# Patient Record
Sex: Female | Born: 1955
Health system: Southern US, Community
[De-identification: ages and names within clinical notes are randomized; demographics above are authoritative.]

## PROBLEM LIST (undated history)

## (undated) DIAGNOSIS — E7800 Pure hypercholesterolemia, unspecified: Secondary | ICD-10-CM

## (undated) HISTORY — PX: ABDOMINAL HYSTERECTOMY: SHX81

---

## 1998-08-01 ENCOUNTER — Encounter: Admission: RE | Admit: 1998-08-01 | Discharge: 1998-10-30 | Payer: Self-pay

## 1998-09-18 ENCOUNTER — Encounter: Admission: RE | Admit: 1998-09-18 | Discharge: 1998-12-17 | Payer: Self-pay | Admitting: Cardiology

## 1999-12-26 ENCOUNTER — Emergency Department (HOSPITAL_COMMUNITY): Admission: EM | Admit: 1999-12-26 | Discharge: 1999-12-26 | Payer: Self-pay | Admitting: Emergency Medicine

## 2000-02-22 ENCOUNTER — Emergency Department (HOSPITAL_COMMUNITY): Admission: EM | Admit: 2000-02-22 | Discharge: 2000-02-23 | Payer: Self-pay | Admitting: Emergency Medicine

## 2000-03-02 ENCOUNTER — Encounter: Payer: Self-pay | Admitting: Emergency Medicine

## 2000-03-02 ENCOUNTER — Emergency Department (HOSPITAL_COMMUNITY): Admission: EM | Admit: 2000-03-02 | Discharge: 2000-03-02 | Payer: Self-pay | Admitting: Emergency Medicine

## 2000-04-14 ENCOUNTER — Ambulatory Visit (HOSPITAL_COMMUNITY): Admission: RE | Admit: 2000-04-14 | Discharge: 2000-04-14 | Payer: Self-pay | Admitting: Internal Medicine

## 2000-04-14 ENCOUNTER — Encounter: Payer: Self-pay | Admitting: Internal Medicine

## 2000-09-23 ENCOUNTER — Emergency Department (HOSPITAL_COMMUNITY): Admission: EM | Admit: 2000-09-23 | Discharge: 2000-09-23 | Payer: Self-pay | Admitting: Emergency Medicine

## 2001-04-18 ENCOUNTER — Encounter: Payer: Self-pay | Admitting: Internal Medicine

## 2001-04-18 ENCOUNTER — Ambulatory Visit (HOSPITAL_COMMUNITY): Admission: RE | Admit: 2001-04-18 | Discharge: 2001-04-18 | Payer: Self-pay | Admitting: Internal Medicine

## 2002-04-20 ENCOUNTER — Encounter: Payer: Self-pay | Admitting: Internal Medicine

## 2002-04-20 ENCOUNTER — Ambulatory Visit (HOSPITAL_COMMUNITY): Admission: RE | Admit: 2002-04-20 | Discharge: 2002-04-20 | Payer: Self-pay | Admitting: Internal Medicine

## 2003-04-25 ENCOUNTER — Ambulatory Visit (HOSPITAL_COMMUNITY): Admission: RE | Admit: 2003-04-25 | Discharge: 2003-04-25 | Payer: Self-pay | Admitting: Internal Medicine

## 2004-04-28 ENCOUNTER — Ambulatory Visit (HOSPITAL_COMMUNITY): Admission: RE | Admit: 2004-04-28 | Discharge: 2004-04-28 | Payer: Self-pay | Admitting: Internal Medicine

## 2004-10-22 ENCOUNTER — Encounter: Admission: RE | Admit: 2004-10-22 | Discharge: 2005-01-20 | Payer: Self-pay | Admitting: Internal Medicine

## 2005-04-29 ENCOUNTER — Emergency Department (HOSPITAL_COMMUNITY): Admission: EM | Admit: 2005-04-29 | Discharge: 2005-04-29 | Payer: Self-pay | Admitting: Emergency Medicine

## 2005-05-11 ENCOUNTER — Ambulatory Visit (HOSPITAL_COMMUNITY): Admission: RE | Admit: 2005-05-11 | Discharge: 2005-05-11 | Payer: Self-pay | Admitting: Internal Medicine

## 2006-05-19 ENCOUNTER — Ambulatory Visit (HOSPITAL_COMMUNITY): Admission: RE | Admit: 2006-05-19 | Discharge: 2006-05-19 | Payer: Self-pay | Admitting: Internal Medicine

## 2006-10-18 ENCOUNTER — Emergency Department (HOSPITAL_COMMUNITY): Admission: EM | Admit: 2006-10-18 | Discharge: 2006-10-18 | Payer: Self-pay | Admitting: Emergency Medicine

## 2007-06-15 ENCOUNTER — Ambulatory Visit (HOSPITAL_COMMUNITY): Admission: RE | Admit: 2007-06-15 | Discharge: 2007-06-15 | Payer: Self-pay | Admitting: Internal Medicine

## 2007-06-23 ENCOUNTER — Encounter: Admission: RE | Admit: 2007-06-23 | Discharge: 2007-06-23 | Payer: Self-pay | Admitting: Internal Medicine

## 2007-08-25 ENCOUNTER — Ambulatory Visit (HOSPITAL_COMMUNITY): Admission: RE | Admit: 2007-08-25 | Discharge: 2007-08-25 | Payer: Self-pay | Admitting: Internal Medicine

## 2008-06-27 ENCOUNTER — Ambulatory Visit (HOSPITAL_COMMUNITY): Admission: RE | Admit: 2008-06-27 | Discharge: 2008-06-27 | Payer: Self-pay | Admitting: Internal Medicine

## 2009-07-10 ENCOUNTER — Ambulatory Visit (HOSPITAL_COMMUNITY): Admission: RE | Admit: 2009-07-10 | Discharge: 2009-07-10 | Payer: Self-pay | Admitting: Internal Medicine

## 2009-10-13 ENCOUNTER — Ambulatory Visit (HOSPITAL_COMMUNITY): Admission: RE | Admit: 2009-10-13 | Discharge: 2009-10-13 | Payer: Self-pay | Admitting: Gastroenterology

## 2010-06-10 ENCOUNTER — Other Ambulatory Visit (HOSPITAL_COMMUNITY): Payer: Self-pay | Admitting: Internal Medicine

## 2010-06-10 DIAGNOSIS — Z1231 Encounter for screening mammogram for malignant neoplasm of breast: Secondary | ICD-10-CM

## 2010-07-14 ENCOUNTER — Ambulatory Visit (HOSPITAL_COMMUNITY)
Admission: RE | Admit: 2010-07-14 | Discharge: 2010-07-14 | Disposition: A | Discharge status: Home / Self Care | Payer: 59 | Source: Ambulatory Visit | Attending: Internal Medicine | Admitting: Internal Medicine

## 2010-07-14 DIAGNOSIS — Z1231 Encounter for screening mammogram for malignant neoplasm of breast: Secondary | ICD-10-CM | POA: Insufficient documentation

## 2011-06-15 ENCOUNTER — Other Ambulatory Visit (HOSPITAL_COMMUNITY): Payer: Self-pay | Admitting: Internal Medicine

## 2011-06-15 DIAGNOSIS — Z1231 Encounter for screening mammogram for malignant neoplasm of breast: Secondary | ICD-10-CM

## 2011-08-05 ENCOUNTER — Ambulatory Visit (HOSPITAL_COMMUNITY)
Admission: RE | Admit: 2011-08-05 | Discharge: 2011-08-05 | Disposition: A | Discharge status: Home / Self Care | Payer: 59 | Source: Ambulatory Visit | Attending: Internal Medicine | Admitting: Internal Medicine

## 2011-08-05 DIAGNOSIS — Z1231 Encounter for screening mammogram for malignant neoplasm of breast: Secondary | ICD-10-CM | POA: Insufficient documentation

## 2012-06-27 ENCOUNTER — Other Ambulatory Visit (HOSPITAL_COMMUNITY): Payer: Self-pay | Admitting: Internal Medicine

## 2012-06-27 DIAGNOSIS — Z1231 Encounter for screening mammogram for malignant neoplasm of breast: Secondary | ICD-10-CM

## 2012-08-08 ENCOUNTER — Ambulatory Visit (HOSPITAL_COMMUNITY)
Admission: RE | Admit: 2012-08-08 | Discharge: 2012-08-08 | Disposition: A | Discharge status: Home / Self Care | Payer: 59 | Source: Ambulatory Visit | Attending: Internal Medicine | Admitting: Internal Medicine

## 2012-08-08 DIAGNOSIS — Z1231 Encounter for screening mammogram for malignant neoplasm of breast: Secondary | ICD-10-CM | POA: Insufficient documentation

## 2013-07-09 ENCOUNTER — Other Ambulatory Visit (HOSPITAL_COMMUNITY): Payer: Self-pay | Admitting: Internal Medicine

## 2013-07-09 DIAGNOSIS — Z1231 Encounter for screening mammogram for malignant neoplasm of breast: Secondary | ICD-10-CM

## 2013-08-10 ENCOUNTER — Ambulatory Visit (HOSPITAL_COMMUNITY)
Admission: RE | Admit: 2013-08-10 | Discharge: 2013-08-10 | Disposition: A | Discharge status: Home / Self Care | Payer: 59 | Source: Ambulatory Visit | Attending: Internal Medicine | Admitting: Internal Medicine

## 2013-08-10 DIAGNOSIS — Z1231 Encounter for screening mammogram for malignant neoplasm of breast: Secondary | ICD-10-CM | POA: Insufficient documentation

## 2014-05-12 ENCOUNTER — Encounter (HOSPITAL_COMMUNITY): Payer: Self-pay | Admitting: *Deleted

## 2014-05-12 ENCOUNTER — Emergency Department (HOSPITAL_COMMUNITY)
Admission: EM | Admit: 2014-05-12 | Discharge: 2014-05-12 | Disposition: A | Discharge status: Home / Self Care | Payer: 59 | Source: Home / Self Care | Attending: Family Medicine | Admitting: Family Medicine

## 2014-05-12 DIAGNOSIS — H109 Unspecified conjunctivitis: Secondary | ICD-10-CM

## 2014-05-12 HISTORY — DX: Pure hypercholesterolemia, unspecified: E78.00

## 2014-05-12 MED ORDER — ERYTHROMYCIN 5 MG/GM OP OINT: TOPICAL_OINTMENT | OPHTHALMIC | Status: DC

## 2014-05-12 NOTE — ED Notes (Signed)
Pt states she was at work today when coworkers pointed out pt having left eye redness.  Denies any c/o's vision changes, drainage, irritation.

## 2014-05-12 NOTE — ED Provider Notes (Signed)
CSN: [id number]     Arrival date & time 05/12/14  1353 History   First MD Initiated Contact with Patient 05/12/14 1521     Chief Complaint  Patient presents with  . Eye Problem   HPI: Patient is a 59 y.o. female presenting with conjunctivitis. The history is provided by the patient.  Conjunctivitis This is a new problem. The current episode started 3 to 5 hours ago. The problem occurs constantly. The problem has not changed since onset.Nothing aggravates the symptoms.  Pt reports co-workers brought to her attention today at work that her left eye was very red. Pt denies that it was red when she awoke this am. Denies injury, pain, visual disturbances or drainage. Pt does admit to a recent URI. Denies any recent fever and reports URI symptoms have resolved. Pt concerned that she has "pink-eye". No known exposures to anyone with known eye infection. Pt has not attempted to use any OTC meds or treatments.   Past Medical History  Diagnosis Date  . Hypercholesterolemia    Past Surgical History  Procedure Laterality Date  . Abdominal hysterectomy     No family history on file. History  Substance Use Topics  . Smoking status: Never Smoker   . Smokeless tobacco: Not on file  . Alcohol Use: No   OB History    No data available     Review of Systems  All other systems reviewed and are negative.   Allergies  Review of patient's allergies indicates no known allergies.  Home Medications   Prior to Admission medications   Medication Sig Start Date End Date Taking? Authorizing Provider  Rosuvastatin Calcium (CRESTOR PO) Take by mouth.   Yes Historical Provider, MD  erythromycin ophthalmic ointment Place a 1/2 inch ribbon of ointment into the lower eyelid every four hours for 7-10 days. 05/12/14   Rhetta Mura Channin Agustin, NP   BP 151/88 mmHg  Pulse 85  Temp(Src) 98.1 F (36.7 C) (Oral)  Resp 16  SpO2 100% Physical Exam  Constitutional: She is oriented to person, place, and time. She  appears well-developed and well-nourished.  HENT:  Head: Normocephalic and atraumatic.  Eyes: Lids are normal. Right eye exhibits no discharge. Left eye exhibits no discharge. Right conjunctiva is not injected. Right conjunctiva has no hemorrhage. Left conjunctiva is injected. Left conjunctiva has no hemorrhage. No scleral icterus.  Very erythematous conjunctiva w/o excessive tearing or purulent drainage.  Cardiovascular: Normal rate.   Pulmonary/Chest: Effort normal.  Neurological: She is alert and oriented to person, place, and time.  Skin: Skin is warm and dry.  Psychiatric: She has a normal mood and affect.    ED Course  Procedures (including critical care time) Labs Review Labs Reviewed - No data to display  Imaging Review No results found.   MDM   1. Conjunctivitis of left eye    Erythematous conjunctiva w/o pain or drainage. Low suspicion for corneal injury given history and lack of pain/tearing. Will treat with Erythromycin Ointment q4h x 7-10 days. Pt is established with Dr Katy Fitch. She is to call tomorrow to arrange a follow up appointment for tomorrow or Tuesday. 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 She is to return to ED sooner if symptoms worsen. Instructions discussed at length and provided in print.    Jeryl Columbia, NP 05/12/14 [phone]

## 2014-05-12 NOTE — Discharge Instructions (Signed)
Use the eye medication as directed. Use good handwashing techniques. 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  Conjunctivitis Conjunctivitis is commonly called "pink eye." Conjunctivitis can be caused by bacterial or viral infection, allergies, or injuries. There is usually redness of the lining of the eye, itching, discomfort, and sometimes discharge. There may be deposits of matter along the eyelids. A viral infection usually causes a watery discharge, while a bacterial infection causes a yellowish, thick discharge. Pink eye is very contagious and spreads by direct contact. You may be given antibiotic eyedrops as part of your treatment. Before using your eye medicine, remove all drainage from the eye by washing gently with warm water and cotton balls. Continue to use the medication until you have awakened 2 mornings in a row without discharge from the eye. Do not rub your eye. This increases the irritation and helps spread infection. Use separate towels from other household members. Wash your hands with soap and water before and after touching your eyes. Use cold compresses to reduce pain and sunglasses to relieve irritation from light. Do not wear contact lenses or wear eye makeup until the infection is gone. SEEK MEDICAL CARE IF:   Your symptoms are not better after 3 days of treatment.  You have increased pain or trouble seeing.  The outer eyelids become very red or swollen. Document Released: 05/27/2004 Document Revised: 07/12/2011 Document Reviewed: 04/19/2005 [facility] Patient Information 2015 [place], [place]. This information is not intended to replace advice given to you by your health care provider. Make sure you discuss any questions you have with your health care provider.

## 2014-05-12 NOTE — ED Notes (Signed)
Updated on wait.  Refreshments provided.

## 2014-07-18 ENCOUNTER — Other Ambulatory Visit (HOSPITAL_COMMUNITY): Payer: Self-pay | Admitting: Internal Medicine

## 2014-07-18 DIAGNOSIS — Z1231 Encounter for screening mammogram for malignant neoplasm of breast: Secondary | ICD-10-CM

## 2014-07-19 ENCOUNTER — Ambulatory Visit (HOSPITAL_COMMUNITY)
Admission: RE | Admit: 2014-07-19 | Discharge: 2014-07-19 | Disposition: A | Discharge status: Home / Self Care | Payer: 59 | Source: Ambulatory Visit | Attending: Internal Medicine | Admitting: Internal Medicine

## 2014-07-19 DIAGNOSIS — Z1231 Encounter for screening mammogram for malignant neoplasm of breast: Secondary | ICD-10-CM | POA: Diagnosis present

## 2015-05-06 MED FILL — ATORVASTATIN 20 MG TABLET: 20 | 90 days supply | Qty: 90 | Fill #1

## 2015-06-18 ENCOUNTER — Other Ambulatory Visit: Payer: Self-pay

## 2015-06-18 DIAGNOSIS — Z1231 Encounter for screening mammogram for malignant neoplasm of breast: Secondary | ICD-10-CM

## 2015-07-15 DIAGNOSIS — E663 Overweight: Secondary | ICD-10-CM | POA: Diagnosis not present

## 2015-07-15 DIAGNOSIS — E785 Hyperlipidemia, unspecified: Secondary | ICD-10-CM | POA: Diagnosis not present

## 2015-07-16 DIAGNOSIS — E784 Other hyperlipidemia: Secondary | ICD-10-CM | POA: Diagnosis not present

## 2015-07-22 ENCOUNTER — Ambulatory Visit
Admission: RE | Admit: 2015-07-22 | Discharge: 2015-07-22 | Disposition: A | Discharge status: Home / Self Care | Payer: 59 | Source: Ambulatory Visit

## 2015-07-22 DIAGNOSIS — Z1231 Encounter for screening mammogram for malignant neoplasm of breast: Secondary | ICD-10-CM | POA: Diagnosis not present

## 2015-08-04 MED FILL — ATORVASTATIN 20 MG TABLET: 20 | 90 days supply | Qty: 90 | Fill #2

## 2015-10-28 DIAGNOSIS — E785 Hyperlipidemia, unspecified: Secondary | ICD-10-CM | POA: Diagnosis not present

## 2015-11-03 MED FILL — ATORVASTATIN 20 MG TABLET: 20 | 90 days supply | Qty: 90 | Fill #0

## 2016-01-15 DIAGNOSIS — R7309 Other abnormal glucose: Secondary | ICD-10-CM | POA: Diagnosis not present

## 2016-01-15 DIAGNOSIS — Z0000 Encounter for general adult medical examination without abnormal findings: Secondary | ICD-10-CM | POA: Diagnosis not present

## 2016-01-15 DIAGNOSIS — E559 Vitamin D deficiency, unspecified: Secondary | ICD-10-CM | POA: Diagnosis not present

## 2016-01-15 DIAGNOSIS — E785 Hyperlipidemia, unspecified: Secondary | ICD-10-CM | POA: Diagnosis not present

## 2016-02-02 MED FILL — ATORVASTATIN 20 MG TABLET: 20 | 90 days supply | Qty: 90 | Fill #1

## 2016-04-30 MED FILL — ATORVASTATIN 20 MG TABLET: 20 | 90 days supply | Qty: 90 | Fill #0

## 2016-05-11 DIAGNOSIS — H2513 Age-related nuclear cataract, bilateral: Secondary | ICD-10-CM | POA: Diagnosis not present

## 2016-05-11 DIAGNOSIS — H40013 Open angle with borderline findings, low risk, bilateral: Secondary | ICD-10-CM | POA: Diagnosis not present

## 2016-05-18 DIAGNOSIS — E785 Hyperlipidemia, unspecified: Secondary | ICD-10-CM | POA: Diagnosis not present

## 2016-06-21 ENCOUNTER — Other Ambulatory Visit: Payer: Self-pay | Admitting: Internal Medicine

## 2016-06-21 DIAGNOSIS — Z1231 Encounter for screening mammogram for malignant neoplasm of breast: Secondary | ICD-10-CM

## 2016-07-22 ENCOUNTER — Ambulatory Visit
Admission: RE | Admit: 2016-07-22 | Discharge: 2016-07-22 | Disposition: A | Discharge status: Home / Self Care | Payer: 59 | Source: Ambulatory Visit | Attending: Internal Medicine | Admitting: Internal Medicine

## 2016-07-22 ENCOUNTER — Ambulatory Visit: Payer: 59

## 2016-07-22 DIAGNOSIS — Z1231 Encounter for screening mammogram for malignant neoplasm of breast: Secondary | ICD-10-CM | POA: Diagnosis not present

## 2016-08-05 MED FILL — ATORVASTATIN 20 MG TABLET: 20 | 90 days supply | Qty: 90 | Fill #1

## 2016-09-16 DIAGNOSIS — E785 Hyperlipidemia, unspecified: Secondary | ICD-10-CM | POA: Diagnosis not present

## 2016-09-16 DIAGNOSIS — E663 Overweight: Secondary | ICD-10-CM | POA: Diagnosis not present

## 2016-11-04 MED FILL — ATORVASTATIN 20 MG TABLET: 20 | 30 days supply | Qty: 30 | Fill #0

## 2016-12-08 MED FILL — ATORVASTATIN 20 MG TABLET: 20 | 90 days supply | Qty: 90 | Fill #1

## 2017-01-17 DIAGNOSIS — E559 Vitamin D deficiency, unspecified: Secondary | ICD-10-CM | POA: Diagnosis not present

## 2017-01-17 DIAGNOSIS — Z0001 Encounter for general adult medical examination with abnormal findings: Secondary | ICD-10-CM | POA: Diagnosis not present

## 2017-01-17 DIAGNOSIS — L03119 Cellulitis of unspecified part of limb: Secondary | ICD-10-CM | POA: Diagnosis not present

## 2017-01-17 DIAGNOSIS — E785 Hyperlipidemia, unspecified: Secondary | ICD-10-CM | POA: Diagnosis not present

## 2017-01-17 DIAGNOSIS — E663 Overweight: Secondary | ICD-10-CM | POA: Diagnosis not present

## 2017-01-17 MED FILL — DOXYCYCLINE HYCLATE 100 MG: 100 | 10 days supply | Qty: 20 | Fill #0

## 2017-01-19 DIAGNOSIS — Z01411 Encounter for gynecological examination (general) (routine) with abnormal findings: Secondary | ICD-10-CM | POA: Diagnosis not present

## 2017-01-19 MED FILL — KETOCONAZOLE 2% CREAM: 2 | 20 days supply | Qty: 30 | Fill #0

## 2017-03-14 MED FILL — ATORVASTATIN 20 MG TABLET: 20 | 60 days supply | Qty: 60 | Fill #2

## 2017-03-31 DIAGNOSIS — E559 Vitamin D deficiency, unspecified: Secondary | ICD-10-CM | POA: Diagnosis not present

## 2017-03-31 DIAGNOSIS — E785 Hyperlipidemia, unspecified: Secondary | ICD-10-CM | POA: Diagnosis not present

## 2017-05-16 MED FILL — ATORVASTATIN 20 MG TABLET: 20 | 30 days supply | Qty: 30 | Fill #0

## 2017-05-31 DIAGNOSIS — E785 Hyperlipidemia, unspecified: Secondary | ICD-10-CM | POA: Diagnosis not present

## 2017-05-31 DIAGNOSIS — E663 Overweight: Secondary | ICD-10-CM | POA: Diagnosis not present

## 2017-05-31 DIAGNOSIS — J069 Acute upper respiratory infection, unspecified: Secondary | ICD-10-CM | POA: Diagnosis not present

## 2017-05-31 MED FILL — AZITHROMYCIN 250 MG TABLET: 250 | 5 days supply | Qty: 6 | Fill #0

## 2017-06-16 MED FILL — ATORVASTATIN 20 MG TABLET: 20 | 30 days supply | Qty: 30 | Fill #1

## 2017-06-27 ENCOUNTER — Other Ambulatory Visit: Payer: Self-pay | Admitting: Internal Medicine

## 2017-06-27 DIAGNOSIS — Z1231 Encounter for screening mammogram for malignant neoplasm of breast: Secondary | ICD-10-CM

## 2017-07-14 DIAGNOSIS — H40013 Open angle with borderline findings, low risk, bilateral: Secondary | ICD-10-CM | POA: Diagnosis not present

## 2017-07-14 DIAGNOSIS — H2513 Age-related nuclear cataract, bilateral: Secondary | ICD-10-CM | POA: Diagnosis not present

## 2017-07-15 MED FILL — ATORVASTATIN 20 MG TABLET: 20 | 30 days supply | Qty: 30 | Fill #2

## 2017-07-28 ENCOUNTER — Ambulatory Visit
Admission: RE | Admit: 2017-07-28 | Discharge: 2017-07-28 | Disposition: A | Discharge status: Home / Self Care | Payer: 59 | Source: Ambulatory Visit | Attending: Internal Medicine | Admitting: Internal Medicine

## 2017-07-28 DIAGNOSIS — Z1231 Encounter for screening mammogram for malignant neoplasm of breast: Secondary | ICD-10-CM

## 2017-08-12 MED FILL — ATORVASTATIN 20 MG TABLET: 20 | 30 days supply | Qty: 30 | Fill #3

## 2017-08-15 MED FILL — BENZONATATE 100 MG CAPS: 100 | 10 days supply | Qty: 60 | Fill #0

## 2017-08-15 MED FILL — AZITHROMYCIN 250 MG TABLET: 250 | 5 days supply | Qty: 6 | Fill #0

## 2017-09-13 MED FILL — ATORVASTATIN 20 MG TABLET: 20 | 30 days supply | Qty: 30 | Fill #4

## 2017-09-27 DIAGNOSIS — E785 Hyperlipidemia, unspecified: Secondary | ICD-10-CM | POA: Diagnosis not present

## 2017-09-27 DIAGNOSIS — D229 Melanocytic nevi, unspecified: Secondary | ICD-10-CM | POA: Diagnosis not present

## 2017-10-12 MED FILL — ATORVASTATIN 20 MG TABLET: 20 | 30 days supply | Qty: 30 | Fill #5

## 2017-11-14 MED FILL — ATORVASTATIN CALCIUM 20 MG: 20 | 30 days supply | Qty: 30 | Fill #0

## 2017-12-14 MED FILL — ATORVASTATIN CALCIUM 20 MG: 20 | 30 days supply | Qty: 30 | Fill #1

## 2018-01-11 DIAGNOSIS — E785 Hyperlipidemia, unspecified: Secondary | ICD-10-CM | POA: Diagnosis not present

## 2018-01-11 DIAGNOSIS — R7309 Other abnormal glucose: Secondary | ICD-10-CM | POA: Diagnosis not present

## 2018-01-11 DIAGNOSIS — E559 Vitamin D deficiency, unspecified: Secondary | ICD-10-CM | POA: Diagnosis not present

## 2018-01-12 MED FILL — ATORVASTATIN CALCIUM 20 MG: 20 | 90 days supply | Qty: 90 | Fill #2

## 2018-01-23 DIAGNOSIS — Z23 Encounter for immunization: Secondary | ICD-10-CM | POA: Diagnosis not present

## 2018-01-23 DIAGNOSIS — E663 Overweight: Secondary | ICD-10-CM | POA: Diagnosis not present

## 2018-01-23 DIAGNOSIS — E785 Hyperlipidemia, unspecified: Secondary | ICD-10-CM | POA: Diagnosis not present

## 2018-01-23 DIAGNOSIS — Z0000 Encounter for general adult medical examination without abnormal findings: Secondary | ICD-10-CM | POA: Diagnosis not present

## 2018-04-17 MED FILL — ATORVASTATIN CALCIUM 20 MG: 20 | 30 days supply | Qty: 30 | Fill #3

## 2018-05-09 ENCOUNTER — Other Ambulatory Visit: Payer: Self-pay | Admitting: Internal Medicine

## 2018-05-09 ENCOUNTER — Ambulatory Visit
Admission: RE | Admit: 2018-05-09 | Discharge: 2018-05-09 | Disposition: A | Discharge status: Home / Self Care | Payer: 59 | Source: Ambulatory Visit | Attending: Internal Medicine | Admitting: Internal Medicine

## 2018-05-09 DIAGNOSIS — E785 Hyperlipidemia, unspecified: Secondary | ICD-10-CM | POA: Diagnosis not present

## 2018-05-09 DIAGNOSIS — M25552 Pain in left hip: Secondary | ICD-10-CM | POA: Diagnosis not present

## 2018-05-09 DIAGNOSIS — M25559 Pain in unspecified hip: Secondary | ICD-10-CM | POA: Diagnosis not present

## 2018-05-09 DIAGNOSIS — E663 Overweight: Secondary | ICD-10-CM | POA: Diagnosis not present

## 2018-05-09 DIAGNOSIS — R030 Elevated blood-pressure reading, without diagnosis of hypertension: Secondary | ICD-10-CM | POA: Diagnosis not present

## 2018-05-17 MED FILL — ATORVASTATIN CALCIUM 20 MG: 20 | 90 days supply | Qty: 90 | Fill #0

## 2018-06-28 ENCOUNTER — Other Ambulatory Visit: Payer: Self-pay | Admitting: Internal Medicine

## 2018-06-28 DIAGNOSIS — Z1231 Encounter for screening mammogram for malignant neoplasm of breast: Secondary | ICD-10-CM

## 2018-08-01 ENCOUNTER — Ambulatory Visit: Payer: 59

## 2018-08-02 ENCOUNTER — Inpatient Hospital Stay: Admission: RE | Admit: 2018-08-02 | Payer: 59 | Source: Ambulatory Visit

## 2018-08-16 MED FILL — ATORVASTATIN 20 MG TABLET: 20 | 90 days supply | Qty: 90 | Fill #0

## 2018-09-07 DIAGNOSIS — E785 Hyperlipidemia, unspecified: Secondary | ICD-10-CM | POA: Diagnosis not present

## 2018-09-07 DIAGNOSIS — R030 Elevated blood-pressure reading, without diagnosis of hypertension: Secondary | ICD-10-CM | POA: Diagnosis not present

## 2018-09-20 ENCOUNTER — Ambulatory Visit: Payer: 59

## 2018-10-25 ENCOUNTER — Other Ambulatory Visit: Payer: Self-pay | Admitting: *Deleted

## 2018-10-25 DIAGNOSIS — Z20822 Contact with and (suspected) exposure to covid-19: Secondary | ICD-10-CM

## 2018-10-29 LAB — NOVEL CORONAVIRUS, NAA: SARS-CoV-2, NAA: NOT DETECTED

## 2018-11-01 ENCOUNTER — Other Ambulatory Visit: Payer: Self-pay | Admitting: Pediatric Intensive Care

## 2018-11-01 ENCOUNTER — Telehealth: Payer: Self-pay | Admitting: *Deleted

## 2018-11-01 DIAGNOSIS — Z20822 Contact with and (suspected) exposure to covid-19: Secondary | ICD-10-CM

## 2018-11-01 NOTE — Telephone Encounter (Signed)
Pt made aware that her test result was negative.  This information was given to pt by [facility].  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

## 2018-11-02 NOTE — Addendum Note (Signed)
Addended by: Brigitte Pulse on: 11/02/2018 03:09 PM   Modules accepted: Orders

## 2018-11-06 ENCOUNTER — Ambulatory Visit
Admission: RE | Admit: 2018-11-06 | Discharge: 2018-11-06 | Disposition: A | Discharge status: Home / Self Care | Payer: 59 | Source: Ambulatory Visit | Attending: Internal Medicine | Admitting: Internal Medicine

## 2018-11-06 ENCOUNTER — Other Ambulatory Visit: Payer: Self-pay

## 2018-11-06 DIAGNOSIS — Z1231 Encounter for screening mammogram for malignant neoplasm of breast: Secondary | ICD-10-CM

## 2018-11-15 MED FILL — ATORVASTATIN 20 MG TABLET: 20 | 90 days supply | Qty: 90 | Fill #0

## 2019-02-01 DIAGNOSIS — E663 Overweight: Secondary | ICD-10-CM | POA: Diagnosis not present

## 2019-02-01 DIAGNOSIS — E559 Vitamin D deficiency, unspecified: Secondary | ICD-10-CM | POA: Diagnosis not present

## 2019-02-01 DIAGNOSIS — R7309 Other abnormal glucose: Secondary | ICD-10-CM | POA: Diagnosis not present

## 2019-02-01 DIAGNOSIS — E785 Hyperlipidemia, unspecified: Secondary | ICD-10-CM | POA: Diagnosis not present

## 2019-02-01 DIAGNOSIS — Z0000 Encounter for general adult medical examination without abnormal findings: Secondary | ICD-10-CM | POA: Diagnosis not present

## 2019-02-01 DIAGNOSIS — Z20818 Contact with and (suspected) exposure to other bacterial communicable diseases: Secondary | ICD-10-CM | POA: Diagnosis not present

## 2019-02-15 MED FILL — ATORVASTATIN 20 MG TABLET: 20 | 90 days supply | Qty: 90 | Fill #1

## 2019-05-16 MED FILL — ATORVASTATIN 20 MG TABLET: 20 | 90 days supply | Qty: 90 | Fill #0

## 2019-08-02 DIAGNOSIS — H6121 Impacted cerumen, right ear: Secondary | ICD-10-CM | POA: Diagnosis not present

## 2019-08-02 DIAGNOSIS — R030 Elevated blood-pressure reading, without diagnosis of hypertension: Secondary | ICD-10-CM | POA: Diagnosis not present

## 2019-08-02 DIAGNOSIS — E785 Hyperlipidemia, unspecified: Secondary | ICD-10-CM | POA: Diagnosis not present

## 2019-08-13 MED FILL — AMOXICILLIN 875 MG TABS: 875 | 7 days supply | Qty: 14 | Fill #0

## 2019-08-20 MED FILL — ATORVASTATIN 20 MG TABLET: 20 | 90 days supply | Qty: 90 | Fill #1

## 2019-08-20 MED FILL — CIPROFLOXACIN-DEXAMETHASONE: 0.3-0.1 | 9 days supply | Qty: 8 | Fill #0

## 2019-08-27 MED FILL — AMOX-CLAV 875-125 MG TABLET: 875-125 | 7 days supply | Qty: 14 | Fill #0

## 2019-09-18 DIAGNOSIS — H60332 Swimmer's ear, left ear: Secondary | ICD-10-CM | POA: Diagnosis not present

## 2019-09-18 DIAGNOSIS — H6121 Impacted cerumen, right ear: Secondary | ICD-10-CM | POA: Diagnosis not present

## 2019-09-18 MED FILL — CIPROFLOXACIN-DEXAMETHASONE: 0.3-0.1 | 14 days supply | Qty: 8 | Fill #0

## 2019-09-26 DIAGNOSIS — H6122 Impacted cerumen, left ear: Secondary | ICD-10-CM | POA: Diagnosis not present

## 2019-09-26 DIAGNOSIS — H9012 Conductive hearing loss, unilateral, left ear, with unrestricted hearing on the contralateral side: Secondary | ICD-10-CM | POA: Diagnosis not present

## 2019-10-02 MED FILL — CIPROFLOXACIN-DEXAMETHASONE: 0.3-0.1 | 14 days supply | Qty: 8 | Fill #0

## 2019-10-11 ENCOUNTER — Other Ambulatory Visit: Payer: Self-pay | Admitting: Internal Medicine

## 2019-10-11 DIAGNOSIS — Z1231 Encounter for screening mammogram for malignant neoplasm of breast: Secondary | ICD-10-CM

## 2019-10-16 DIAGNOSIS — H6122 Impacted cerumen, left ear: Secondary | ICD-10-CM | POA: Diagnosis not present

## 2019-11-13 ENCOUNTER — Ambulatory Visit
Admission: RE | Admit: 2019-11-13 | Discharge: 2019-11-13 | Disposition: A | Discharge status: Home / Self Care | Payer: 59 | Source: Ambulatory Visit | Attending: Internal Medicine | Admitting: Internal Medicine

## 2019-11-13 ENCOUNTER — Other Ambulatory Visit: Payer: Self-pay

## 2019-11-13 DIAGNOSIS — Z1231 Encounter for screening mammogram for malignant neoplasm of breast: Secondary | ICD-10-CM

## 2019-11-20 MED FILL — ATORVASTATIN 20 MG TABLET: 20 | 90 days supply | Qty: 90 | Fill #2

## 2019-12-11 DIAGNOSIS — Z86010 Personal history of colonic polyps: Secondary | ICD-10-CM | POA: Diagnosis not present

## 2019-12-11 DIAGNOSIS — Z1211 Encounter for screening for malignant neoplasm of colon: Secondary | ICD-10-CM | POA: Diagnosis not present

## 2019-12-11 MED FILL — CLENPIQ 10-3.5-12 MG-GM -GM: 10-3.5-12 M | 2 days supply | Qty: 320 | Fill #0

## 2020-01-14 DIAGNOSIS — Z1211 Encounter for screening for malignant neoplasm of colon: Secondary | ICD-10-CM | POA: Diagnosis not present

## 2020-01-15 DIAGNOSIS — H40013 Open angle with borderline findings, low risk, bilateral: Secondary | ICD-10-CM | POA: Diagnosis not present

## 2020-01-15 DIAGNOSIS — H2513 Age-related nuclear cataract, bilateral: Secondary | ICD-10-CM | POA: Diagnosis not present

## 2020-02-04 DIAGNOSIS — R030 Elevated blood-pressure reading, without diagnosis of hypertension: Secondary | ICD-10-CM | POA: Diagnosis not present

## 2020-02-04 DIAGNOSIS — Z0000 Encounter for general adult medical examination without abnormal findings: Secondary | ICD-10-CM | POA: Diagnosis not present

## 2020-02-04 DIAGNOSIS — R7309 Other abnormal glucose: Secondary | ICD-10-CM | POA: Diagnosis not present

## 2020-02-04 DIAGNOSIS — E785 Hyperlipidemia, unspecified: Secondary | ICD-10-CM | POA: Diagnosis not present

## 2020-02-04 DIAGNOSIS — E663 Overweight: Secondary | ICD-10-CM | POA: Diagnosis not present

## 2020-02-04 DIAGNOSIS — E559 Vitamin D deficiency, unspecified: Secondary | ICD-10-CM | POA: Diagnosis not present

## 2020-02-05 DIAGNOSIS — Z0000 Encounter for general adult medical examination without abnormal findings: Secondary | ICD-10-CM | POA: Diagnosis not present

## 2020-02-05 DIAGNOSIS — Z01411 Encounter for gynecological examination (general) (routine) with abnormal findings: Secondary | ICD-10-CM | POA: Diagnosis not present

## 2020-02-05 DIAGNOSIS — E785 Hyperlipidemia, unspecified: Secondary | ICD-10-CM | POA: Diagnosis not present

## 2020-02-05 DIAGNOSIS — E559 Vitamin D deficiency, unspecified: Secondary | ICD-10-CM | POA: Diagnosis not present

## 2020-02-05 DIAGNOSIS — R7309 Other abnormal glucose: Secondary | ICD-10-CM | POA: Diagnosis not present

## 2020-02-19 MED FILL — ATORVASTATIN 20 MG TABLET: 20 | 90 days supply | Qty: 90 | Fill #3

## 2020-05-24 ENCOUNTER — Other Ambulatory Visit (HOSPITAL_COMMUNITY): Payer: Self-pay | Admitting: Internal Medicine

## 2020-05-26 MED FILL — ATORVASTATIN CALCIUM 20 MG: 20 | 90 days supply | Qty: 90 | Fill #0

## 2020-08-04 DIAGNOSIS — M25552 Pain in left hip: Secondary | ICD-10-CM | POA: Diagnosis not present

## 2020-08-04 DIAGNOSIS — E785 Hyperlipidemia, unspecified: Secondary | ICD-10-CM | POA: Diagnosis not present

## 2020-08-16 IMAGING — MG DIGITAL SCREENING BILATERAL MAMMOGRAM WITH TOMO AND CAD
6 of 12 series · 6 of 36 positions shown · non-contrast
Comparison: Previous exam(s).

CLINICAL DATA: Screening.

EXAM:
DIGITAL SCREENING BILATERAL MAMMOGRAM WITH TOMO AND CAD

[R MLO synth-2D (1 of 2)]
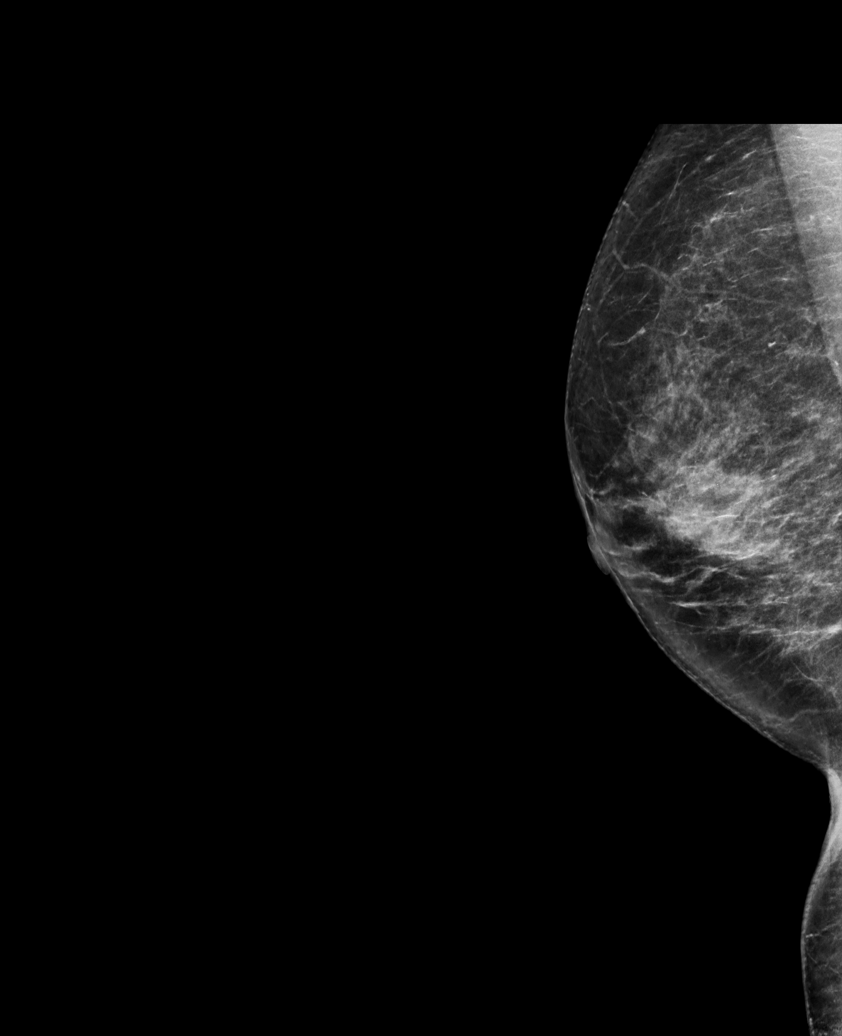

[L MLO synth-2D (1 of 2)]
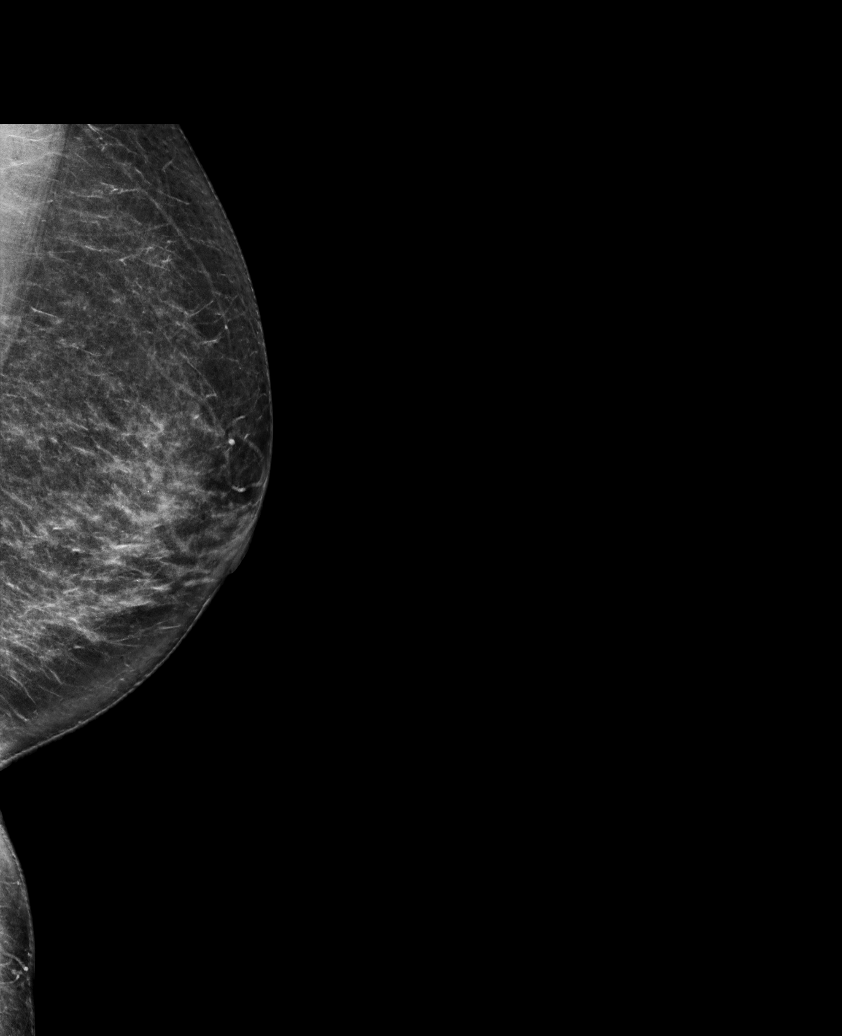

[L MLO synth-2D (2 of 2)]
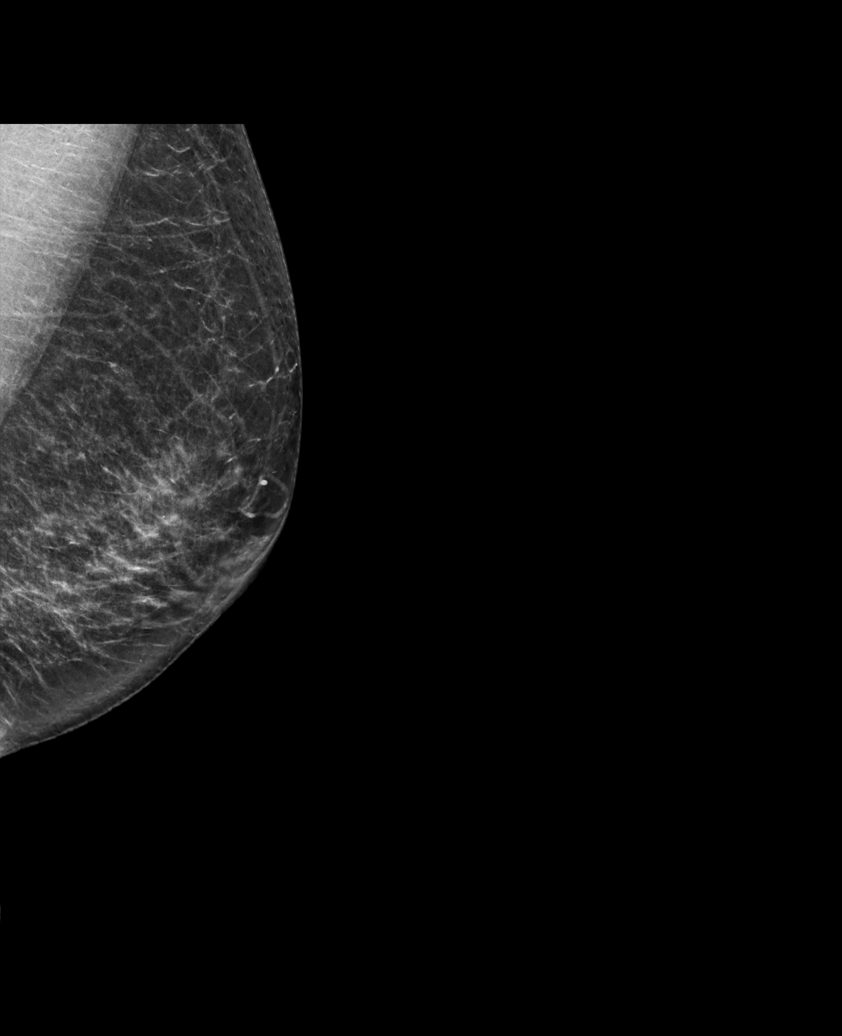

[R CC synth-2D]
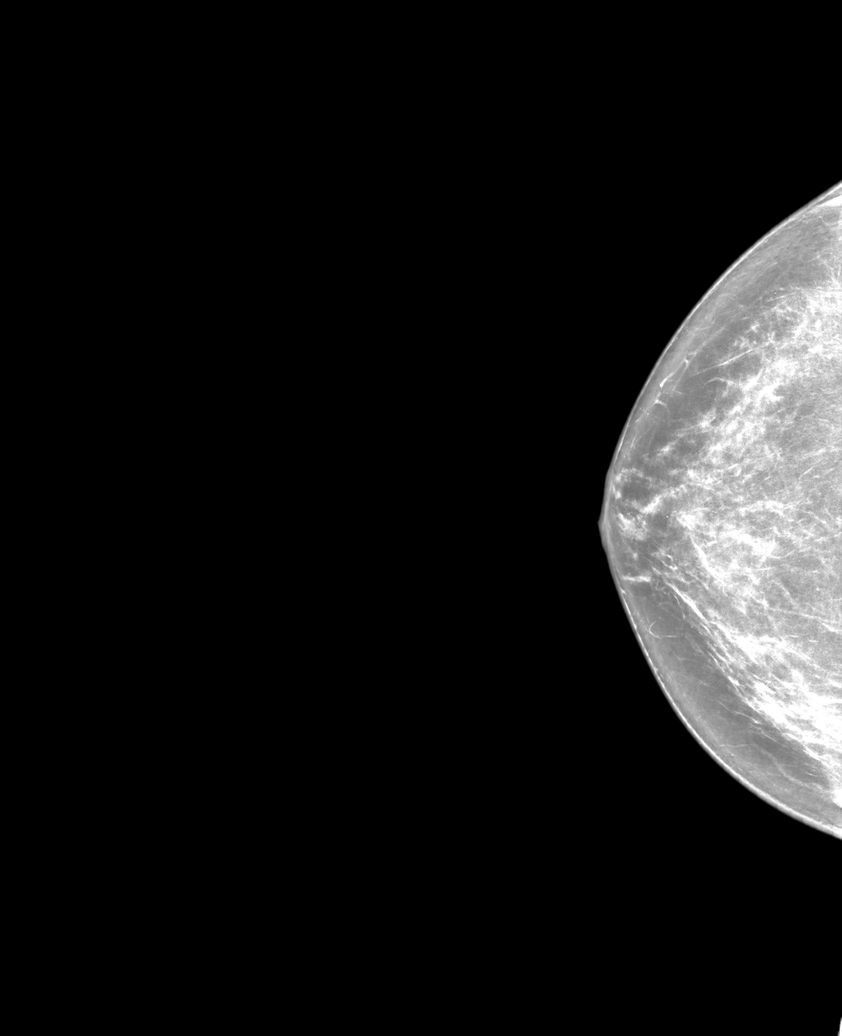

[R MLO synth-2D (2 of 2)]
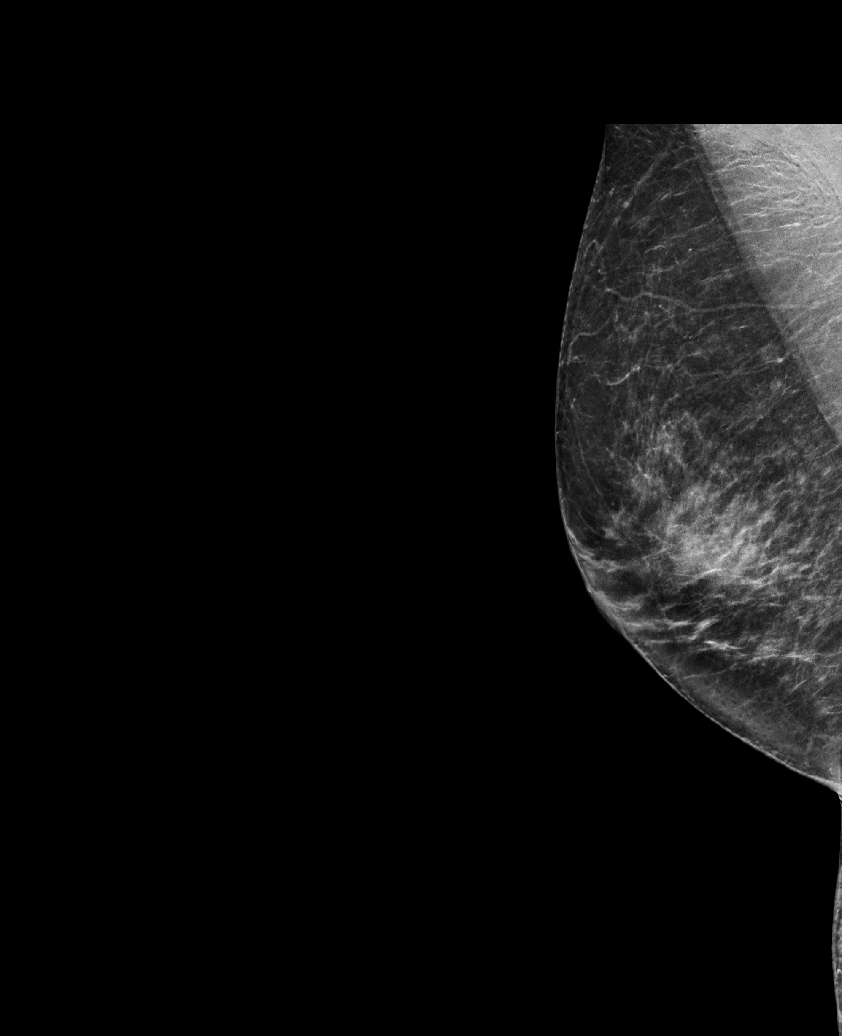

[L CC synth-2D]
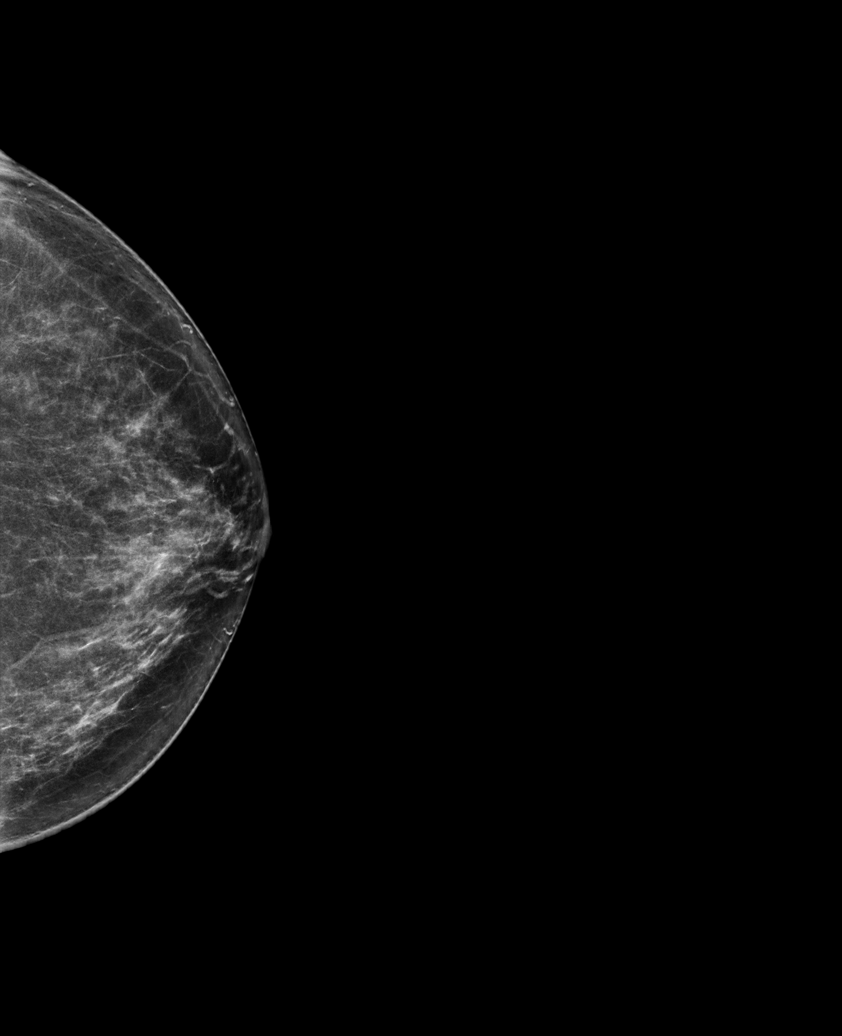

[6 of 36 positions shown; findings below may reference images not displayed]

ACR Breast Density Category c: The breast tissue is heterogeneously
dense, which may obscure small masses.
FINDINGS: There are no findings suspicious for malignancy. Images were
processed with CAD.
IMPRESSION: No mammographic evidence of malignancy. A result letter of this
screening mammogram will be mailed directly to the patient.

RECOMMENDATION:
Screening mammogram in one year. (Code:FT-U-LHB)

BI-RADS CATEGORY  1: Negative.

## 2020-08-25 ENCOUNTER — Other Ambulatory Visit (HOSPITAL_COMMUNITY): Payer: Self-pay

## 2020-08-25 MED FILL — Atorvastatin Calcium Tab 20 MG (Base Equivalent): ORAL | 90 days supply | Qty: 90 | Fill #0 | Status: AC

## 2020-08-28 ENCOUNTER — Other Ambulatory Visit: Payer: Self-pay

## 2020-08-28 ENCOUNTER — Encounter (HOSPITAL_COMMUNITY): Payer: Self-pay

## 2020-08-28 ENCOUNTER — Emergency Department (HOSPITAL_COMMUNITY)
Admission: EM | Admit: 2020-08-28 | Discharge: 2020-08-29 | Disposition: A | Discharge status: Home / Self Care | Payer: 59 | Attending: Emergency Medicine | Admitting: Emergency Medicine

## 2020-08-28 DIAGNOSIS — S0501XA Injury of conjunctiva and corneal abrasion without foreign body, right eye, initial encounter: Secondary | ICD-10-CM | POA: Diagnosis not present

## 2020-08-28 DIAGNOSIS — X58XXXA Exposure to other specified factors, initial encounter: Secondary | ICD-10-CM | POA: Insufficient documentation

## 2020-08-28 DIAGNOSIS — Y990 Civilian activity done for income or pay: Secondary | ICD-10-CM | POA: Insufficient documentation

## 2020-08-28 DIAGNOSIS — S058X1A Other injuries of right eye and orbit, initial encounter: Secondary | ICD-10-CM | POA: Diagnosis present

## 2020-08-28 NOTE — ED Triage Notes (Signed)
Pt reports right eye pain and possible foreign body. Sts while at work today eye became pink, draining and feels like rocks are grinding against eye. Pt struggling to open eye.

## 2020-08-29 ENCOUNTER — Other Ambulatory Visit (HOSPITAL_COMMUNITY): Payer: Self-pay

## 2020-08-29 DIAGNOSIS — S0501XA Injury of conjunctiva and corneal abrasion without foreign body, right eye, initial encounter: Secondary | ICD-10-CM | POA: Diagnosis not present

## 2020-08-29 MED ORDER — ERYTHROMYCIN 5 MG/GM OP OINT
TOPICAL_OINTMENT | Freq: Once | OPHTHALMIC | Status: AC
  Filled 2020-08-29: qty 3.5

## 2020-08-29 MED ORDER — FLUORESCEIN SODIUM 1 MG OP STRP
1.0000 | ORAL_STRIP | Freq: Once | OPHTHALMIC | Status: AC
  Administered 2020-08-29: 1 via OPHTHALMIC
  Filled 2020-08-29: qty 1

## 2020-08-29 MED ORDER — OFLOXACIN 0.3 % OP SOLN
1.0000 [drp] | Freq: Four times a day (QID) | OPHTHALMIC | 0 refills | Status: AC
  Filled 2020-08-29: qty 5, 25d supply, fill #0

## 2020-08-29 MED ORDER — TETRACAINE HCL 0.5 % OP SOLN
2.0000 [drp] | Freq: Once | OPHTHALMIC | Status: AC
  Administered 2020-08-29: 2 [drp] via OPHTHALMIC
  Filled 2020-08-29: qty 4

## 2020-08-29 NOTE — ED Provider Notes (Signed)
[facility] DEPT Provider Note   CSN: [id number] Arrival date & time: 08/28/20  2330     History Chief Complaint  Patient presents with  . Eye Pain    Rachel Rogers is a 65 y.o. female.  The history is provided by the patient.  Eye Pain This is a new problem. The current episode started 6 to 12 hours ago. The problem occurs constantly. The problem has been gradually worsening. Pertinent negatives include no headaches. Nothing aggravates the symptoms. Nothing relieves the symptoms.   Patient reports she was at work when she began having the feeling of something was in her right eye and right eye pain.  Denies any trauma.  She works in [employer] and denies any known foreign bodies in her eye.  She does not wear corrective lenses.  No previous eye surgery    Past Medical History:  Diagnosis Date  . Hypercholesterolemia     There are no problems to display for this patient.   Past Surgical History:  Procedure Laterality Date  . ABDOMINAL HYSTERECTOMY       OB History   No obstetric history on file.     Family History  Problem Relation Age of Onset  . Breast cancer Maternal Aunt     Social History   Tobacco Use  . Smoking status: Never Smoker  . Smokeless tobacco: Never Used  Substance Use Topics  . Alcohol use: No  . Drug use: No    Home Medications Prior to Admission medications   Medication Sig Start Date End Date Taking? Authorizing Provider  atorvastatin (LIPITOR) 20 MG tablet TAKE 1 TABLET BY MOUTH ONCE DAILY Patient taking differently: Take 20 mg by mouth daily. 05/24/20 05/24/21 Yes Willey Blade, MD  ferrous sulfate 325 (65 FE) MG tablet Take 325 mg by mouth daily with breakfast.   Yes [provider]  PRESCRIPTION MEDICATION NOVA Complete  Mix 1 scoop in 12 ounces of water or juice   Yes [provider]    Allergies    Patient has no known allergies.  Review of Systems   Review of  Systems  Constitutional: Negative for fever.  Eyes: Positive for pain, redness and visual disturbance.  Neurological: Negative for headaches.    Physical Exam Updated Vital Signs BP (!) 165/88 (BP Location: Right Arm)   Pulse 85   Temp 98.6 F (37 C) (Oral)   Resp 15   Ht 1.626 m (5\' 4" )   Wt 73.5 kg   SpO2 100%   BMI 27.81 kg/m   Physical Exam CONSTITUTIONAL: Well developed/well nourished HEAD: Normocephalic/atraumatic EYES: EOMI/PERRL.  Visual acuity OD 20/30 IOP in both eyes 20.  No corneal hazing.  No foreign bodies noted.  Large central corneal abrasion is noted.  Negative Seidel test Conjunctival erythema right eye ENMT: Mucous membranes moist NECK: supple no meningeal signs LUNGS: no apparent distress NEURO: Pt is awake/alert/appropriate, moves all extremitiesx4.  No facial droop.   EXTREMITIES:full ROM SKIN: warm, color normal PSYCH: no abnormalities of mood noted, alert and oriented to situation  ED Results / Procedures / Treatments   Labs (all labs ordered are listed, but only abnormal results are displayed) Labs Reviewed - No data to display  EKG None  Radiology No results found.  Procedures Procedures   Medications Ordered in ED Medications  tetracaine (PONTOCAINE) 0.5 % ophthalmic solution 2 drop (2 drops Right Eye Given by Other 08/29/20 0124)  fluorescein ophthalmic strip 1 strip (1  strip Right Eye Given by Other 08/29/20 0125)  fluorescein ophthalmic strip 1 strip (1 strip Both Eyes Given by Other 08/29/20 0125)  erythromycin ophthalmic ointment ( Right Eye Given 08/29/20 0225)    ED Course  I have reviewed the triage vital signs and the nursing notes.     MDM Rules/Calculators/A&P                          Patient with large corneal abrasion.  Denies any known trauma.  There is no foreign bodies.  Pupils are equal and reactive.  IOP in both eyes was approximately 20, but patient had significant difficulty keeping her eye open.  She had no  pain in her left eye.  Low suspicion for glaucoma.  No signs of any open globe Will place on topical antibiotics and refer to ophthalmology.  Patient already has established relationship with a local ophthalmologist Final Clinical Impression(s) / ED Diagnoses Final diagnoses:  Abrasion of right cornea, initial encounter    Rx / DC Orders ED Discharge Orders    None       Ripley Fraise, MD 08/29/20 [phone]

## 2020-08-29 NOTE — ED Notes (Signed)
Visual acuity:rt.eye: 20/30, lt. Eye: 20/20, both eyes:20/20 without corrective lens. Pt. Has no corrective lenses.

## 2020-09-01 DIAGNOSIS — S0501XD Injury of conjunctiva and corneal abrasion without foreign body, right eye, subsequent encounter: Secondary | ICD-10-CM | POA: Diagnosis not present

## 2020-11-25 ENCOUNTER — Other Ambulatory Visit (HOSPITAL_COMMUNITY): Payer: Self-pay

## 2020-11-25 MED FILL — Atorvastatin Calcium Tab 20 MG (Base Equivalent): ORAL | 90 days supply | Qty: 90 | Fill #1 | Status: AC

## 2020-12-26 ENCOUNTER — Other Ambulatory Visit: Payer: Self-pay | Admitting: Internal Medicine

## 2020-12-26 DIAGNOSIS — Z1231 Encounter for screening mammogram for malignant neoplasm of breast: Secondary | ICD-10-CM

## 2021-01-13 ENCOUNTER — Ambulatory Visit: Payer: 59

## 2021-02-03 ENCOUNTER — Ambulatory Visit
Admission: RE | Admit: 2021-02-03 | Discharge: 2021-02-03 | Disposition: A | Discharge status: Home / Self Care | Payer: 59 | Source: Ambulatory Visit | Attending: Internal Medicine | Admitting: Internal Medicine

## 2021-02-03 ENCOUNTER — Other Ambulatory Visit: Payer: Self-pay

## 2021-02-03 DIAGNOSIS — Z0000 Encounter for general adult medical examination without abnormal findings: Secondary | ICD-10-CM | POA: Diagnosis not present

## 2021-02-03 DIAGNOSIS — Z6828 Body mass index (BMI) 28.0-28.9, adult: Secondary | ICD-10-CM | POA: Diagnosis not present

## 2021-02-03 DIAGNOSIS — Z1231 Encounter for screening mammogram for malignant neoplasm of breast: Secondary | ICD-10-CM

## 2021-02-03 DIAGNOSIS — E559 Vitamin D deficiency, unspecified: Secondary | ICD-10-CM | POA: Diagnosis not present

## 2021-02-03 DIAGNOSIS — Z01411 Encounter for gynecological examination (general) (routine) with abnormal findings: Secondary | ICD-10-CM | POA: Diagnosis not present

## 2021-02-03 DIAGNOSIS — E785 Hyperlipidemia, unspecified: Secondary | ICD-10-CM | POA: Diagnosis not present

## 2021-02-03 DIAGNOSIS — R030 Elevated blood-pressure reading, without diagnosis of hypertension: Secondary | ICD-10-CM | POA: Diagnosis not present

## 2021-02-03 DIAGNOSIS — R7309 Other abnormal glucose: Secondary | ICD-10-CM | POA: Diagnosis not present

## 2021-02-09 ENCOUNTER — Other Ambulatory Visit: Payer: Self-pay | Admitting: Internal Medicine

## 2021-02-09 DIAGNOSIS — R928 Other abnormal and inconclusive findings on diagnostic imaging of breast: Secondary | ICD-10-CM

## 2021-02-24 ENCOUNTER — Other Ambulatory Visit: Payer: Self-pay

## 2021-02-24 ENCOUNTER — Ambulatory Visit: Payer: 59

## 2021-02-24 ENCOUNTER — Ambulatory Visit
Admission: RE | Admit: 2021-02-24 | Discharge: 2021-02-24 | Disposition: A | Discharge status: Home / Self Care | Payer: 59 | Source: Ambulatory Visit | Attending: Internal Medicine | Admitting: Internal Medicine

## 2021-02-24 DIAGNOSIS — R928 Other abnormal and inconclusive findings on diagnostic imaging of breast: Secondary | ICD-10-CM | POA: Diagnosis not present

## 2021-02-27 ENCOUNTER — Other Ambulatory Visit (HOSPITAL_COMMUNITY): Payer: Self-pay

## 2021-02-27 MED FILL — Atorvastatin Calcium Tab 20 MG (Base Equivalent): ORAL | 90 days supply | Qty: 90 | Fill #2 | Status: AC

## 2021-04-21 DIAGNOSIS — Z23 Encounter for immunization: Secondary | ICD-10-CM | POA: Diagnosis not present

## 2021-05-29 ENCOUNTER — Other Ambulatory Visit (HOSPITAL_COMMUNITY): Payer: Self-pay

## 2021-06-01 ENCOUNTER — Other Ambulatory Visit (HOSPITAL_COMMUNITY): Payer: Self-pay

## 2021-06-01 MED ORDER — ATORVASTATIN CALCIUM 20 MG PO TABS
20.0000 mg | ORAL_TABLET | Freq: Every day | ORAL | 3 refills | Status: DC
  Filled 2021-06-01: qty 90, 90d supply, fill #0
  Filled 2021-09-01: qty 90, 90d supply, fill #1
  Filled 2021-12-04: qty 90, 90d supply, fill #2
  Filled 2022-03-09: qty 90, 90d supply, fill #3

## 2021-06-30 DIAGNOSIS — Z23 Encounter for immunization: Secondary | ICD-10-CM | POA: Diagnosis not present

## 2021-08-04 DIAGNOSIS — E785 Hyperlipidemia, unspecified: Secondary | ICD-10-CM | POA: Diagnosis not present

## 2021-08-04 DIAGNOSIS — I10 Essential (primary) hypertension: Secondary | ICD-10-CM | POA: Diagnosis not present

## 2021-09-01 ENCOUNTER — Other Ambulatory Visit (HOSPITAL_COMMUNITY): Payer: Self-pay

## 2021-12-04 ENCOUNTER — Other Ambulatory Visit (HOSPITAL_COMMUNITY): Payer: Self-pay

## 2022-01-15 DIAGNOSIS — R519 Headache, unspecified: Secondary | ICD-10-CM | POA: Diagnosis not present

## 2022-01-15 DIAGNOSIS — Z03818 Encounter for observation for suspected exposure to other biological agents ruled out: Secondary | ICD-10-CM | POA: Diagnosis not present

## 2022-01-26 ENCOUNTER — Other Ambulatory Visit: Payer: Self-pay | Admitting: Internal Medicine

## 2022-01-26 DIAGNOSIS — Z1231 Encounter for screening mammogram for malignant neoplasm of breast: Secondary | ICD-10-CM

## 2022-02-02 DIAGNOSIS — R030 Elevated blood-pressure reading, without diagnosis of hypertension: Secondary | ICD-10-CM | POA: Diagnosis not present

## 2022-02-02 DIAGNOSIS — Z0000 Encounter for general adult medical examination without abnormal findings: Secondary | ICD-10-CM | POA: Diagnosis not present

## 2022-02-02 DIAGNOSIS — E559 Vitamin D deficiency, unspecified: Secondary | ICD-10-CM | POA: Diagnosis not present

## 2022-02-02 DIAGNOSIS — R7309 Other abnormal glucose: Secondary | ICD-10-CM | POA: Diagnosis not present

## 2022-02-02 DIAGNOSIS — E785 Hyperlipidemia, unspecified: Secondary | ICD-10-CM | POA: Diagnosis not present

## 2022-02-09 DIAGNOSIS — E785 Hyperlipidemia, unspecified: Secondary | ICD-10-CM | POA: Diagnosis not present

## 2022-02-09 DIAGNOSIS — Z0001 Encounter for general adult medical examination with abnormal findings: Secondary | ICD-10-CM | POA: Diagnosis not present

## 2022-02-09 DIAGNOSIS — R030 Elevated blood-pressure reading, without diagnosis of hypertension: Secondary | ICD-10-CM | POA: Diagnosis not present

## 2022-02-09 DIAGNOSIS — Z6827 Body mass index (BMI) 27.0-27.9, adult: Secondary | ICD-10-CM | POA: Diagnosis not present

## 2022-02-09 DIAGNOSIS — M79644 Pain in right finger(s): Secondary | ICD-10-CM | POA: Diagnosis not present

## 2022-02-23 DIAGNOSIS — M79644 Pain in right finger(s): Secondary | ICD-10-CM | POA: Diagnosis not present

## 2022-03-02 ENCOUNTER — Ambulatory Visit
Admission: RE | Admit: 2022-03-02 | Discharge: 2022-03-02 | Disposition: A | Discharge status: Home / Self Care | Payer: 59 | Source: Ambulatory Visit | Attending: Internal Medicine | Admitting: Internal Medicine

## 2022-03-02 DIAGNOSIS — Z1231 Encounter for screening mammogram for malignant neoplasm of breast: Secondary | ICD-10-CM

## 2022-03-05 DIAGNOSIS — M65311 Trigger thumb, right thumb: Secondary | ICD-10-CM | POA: Diagnosis not present

## 2022-03-09 ENCOUNTER — Other Ambulatory Visit (HOSPITAL_COMMUNITY): Payer: Self-pay

## 2022-04-02 DIAGNOSIS — M65311 Trigger thumb, right thumb: Secondary | ICD-10-CM | POA: Diagnosis not present

## 2022-05-18 DIAGNOSIS — H40013 Open angle with borderline findings, low risk, bilateral: Secondary | ICD-10-CM | POA: Diagnosis not present

## 2022-05-18 DIAGNOSIS — H3561 Retinal hemorrhage, right eye: Secondary | ICD-10-CM | POA: Diagnosis not present

## 2022-05-18 DIAGNOSIS — H2513 Age-related nuclear cataract, bilateral: Secondary | ICD-10-CM | POA: Diagnosis not present

## 2022-06-08 ENCOUNTER — Other Ambulatory Visit (HOSPITAL_COMMUNITY): Payer: Self-pay

## 2022-06-08 MED ORDER — ATORVASTATIN CALCIUM 20 MG PO TABS
20.0000 mg | ORAL_TABLET | Freq: Every day | ORAL | 3 refills | Status: DC
  Filled 2022-06-08: qty 90, 90d supply, fill #0
  Filled 2022-09-14: qty 90, 90d supply, fill #1
  Filled 2022-12-17: qty 90, 90d supply, fill #2
  Filled 2023-03-18: qty 90, 90d supply, fill #3

## 2022-07-20 DIAGNOSIS — H40013 Open angle with borderline findings, low risk, bilateral: Secondary | ICD-10-CM | POA: Diagnosis not present

## 2022-07-20 DIAGNOSIS — H3561 Retinal hemorrhage, right eye: Secondary | ICD-10-CM | POA: Diagnosis not present

## 2022-07-20 DIAGNOSIS — H2513 Age-related nuclear cataract, bilateral: Secondary | ICD-10-CM | POA: Diagnosis not present

## 2022-08-10 ENCOUNTER — Other Ambulatory Visit (HOSPITAL_COMMUNITY): Payer: Self-pay

## 2022-08-10 DIAGNOSIS — I10 Essential (primary) hypertension: Secondary | ICD-10-CM | POA: Diagnosis not present

## 2022-08-10 DIAGNOSIS — E785 Hyperlipidemia, unspecified: Secondary | ICD-10-CM | POA: Diagnosis not present

## 2022-08-10 MED ORDER — HYDROCHLOROTHIAZIDE 12.5 MG PO TABS
ORAL_TABLET | ORAL | 3 refills | Status: DC
  Filled 2022-08-10: qty 90, 90d supply, fill #0
  Filled 2022-11-08: qty 90, 90d supply, fill #1
  Filled 2023-02-08: qty 90, 90d supply, fill #2
  Filled 2023-05-10: qty 90, 90d supply, fill #3

## 2022-09-14 ENCOUNTER — Other Ambulatory Visit (HOSPITAL_COMMUNITY): Payer: Self-pay

## 2022-11-08 ENCOUNTER — Other Ambulatory Visit (HOSPITAL_COMMUNITY): Payer: Self-pay

## 2022-11-15 DIAGNOSIS — I10 Essential (primary) hypertension: Secondary | ICD-10-CM | POA: Diagnosis not present

## 2022-11-15 DIAGNOSIS — L84 Corns and callosities: Secondary | ICD-10-CM | POA: Diagnosis not present

## 2022-12-17 ENCOUNTER — Other Ambulatory Visit (HOSPITAL_COMMUNITY): Payer: Self-pay

## 2023-02-01 ENCOUNTER — Other Ambulatory Visit: Payer: Self-pay | Admitting: Internal Medicine

## 2023-02-01 DIAGNOSIS — Z1231 Encounter for screening mammogram for malignant neoplasm of breast: Secondary | ICD-10-CM

## 2023-02-08 ENCOUNTER — Other Ambulatory Visit (HOSPITAL_COMMUNITY): Payer: Self-pay

## 2023-02-08 DIAGNOSIS — Z0001 Encounter for general adult medical examination with abnormal findings: Secondary | ICD-10-CM | POA: Diagnosis not present

## 2023-02-08 DIAGNOSIS — I10 Essential (primary) hypertension: Secondary | ICD-10-CM | POA: Diagnosis not present

## 2023-02-08 DIAGNOSIS — E785 Hyperlipidemia, unspecified: Secondary | ICD-10-CM | POA: Diagnosis not present

## 2023-02-08 DIAGNOSIS — Z6827 Body mass index (BMI) 27.0-27.9, adult: Secondary | ICD-10-CM | POA: Diagnosis not present

## 2023-03-08 ENCOUNTER — Ambulatory Visit
Admission: RE | Admit: 2023-03-08 | Discharge: 2023-03-08 | Disposition: A | Discharge status: Home / Self Care | Payer: Commercial Managed Care - PPO | Source: Ambulatory Visit | Attending: Internal Medicine | Admitting: Internal Medicine

## 2023-03-08 DIAGNOSIS — Z1231 Encounter for screening mammogram for malignant neoplasm of breast: Secondary | ICD-10-CM

## 2023-03-18 ENCOUNTER — Other Ambulatory Visit (HOSPITAL_COMMUNITY): Payer: Self-pay

## 2023-05-10 ENCOUNTER — Other Ambulatory Visit (HOSPITAL_COMMUNITY): Payer: Self-pay

## 2023-06-28 ENCOUNTER — Other Ambulatory Visit (HOSPITAL_COMMUNITY): Payer: Self-pay

## 2023-06-28 DIAGNOSIS — B002 Herpesviral gingivostomatitis and pharyngotonsillitis: Secondary | ICD-10-CM | POA: Diagnosis not present

## 2023-06-28 DIAGNOSIS — I10 Essential (primary) hypertension: Secondary | ICD-10-CM | POA: Diagnosis not present

## 2023-06-28 DIAGNOSIS — R051 Acute cough: Secondary | ICD-10-CM | POA: Diagnosis not present

## 2023-06-28 MED ORDER — METHYLPREDNISOLONE 4 MG PO TBPK
ORAL_TABLET | ORAL | 0 refills | Status: DC
  Filled 2023-06-28: qty 21, 6d supply, fill #0

## 2023-06-28 MED ORDER — VALACYCLOVIR HCL 500 MG PO TABS
500.0000 mg | ORAL_TABLET | Freq: Two times a day (BID) | ORAL | 1 refills | Status: AC
  Filled 2023-06-28: qty 14, 7d supply, fill #0

## 2023-06-28 MED ORDER — HYDROCODONE BIT-HOMATROP MBR 5-1.5 MG/5ML PO SOLN
5.0000 mL | ORAL | 0 refills | Status: DC | PRN
  Filled 2023-06-28: qty 180, 6d supply, fill #0

## 2023-06-29 ENCOUNTER — Other Ambulatory Visit (HOSPITAL_COMMUNITY): Payer: Self-pay

## 2023-07-20 ENCOUNTER — Other Ambulatory Visit (HOSPITAL_COMMUNITY): Payer: Self-pay

## 2023-07-20 MED ORDER — ATORVASTATIN CALCIUM 20 MG PO TABS
20.0000 mg | ORAL_TABLET | Freq: Every day | ORAL | 3 refills | Status: AC
  Filled 2023-07-20: qty 90, 90d supply, fill #0
  Filled 2023-11-01: qty 90, 90d supply, fill #1
  Filled 2024-02-07: qty 90, 90d supply, fill #2
  Filled 2024-05-15: qty 90, 90d supply, fill #0

## 2023-07-26 DIAGNOSIS — H2513 Age-related nuclear cataract, bilateral: Secondary | ICD-10-CM | POA: Diagnosis not present

## 2023-07-26 DIAGNOSIS — H40013 Open angle with borderline findings, low risk, bilateral: Secondary | ICD-10-CM | POA: Diagnosis not present

## 2023-08-09 ENCOUNTER — Other Ambulatory Visit (HOSPITAL_COMMUNITY): Payer: Self-pay

## 2023-08-09 DIAGNOSIS — I10 Essential (primary) hypertension: Secondary | ICD-10-CM | POA: Diagnosis not present

## 2023-08-09 DIAGNOSIS — E785 Hyperlipidemia, unspecified: Secondary | ICD-10-CM | POA: Diagnosis not present

## 2023-08-09 MED ORDER — HYDROCHLOROTHIAZIDE 12.5 MG PO TABS
12.5000 mg | ORAL_TABLET | Freq: Every day | ORAL | 3 refills | Status: AC
  Filled 2023-08-09: qty 90, 90d supply, fill #0
  Filled 2023-11-15: qty 90, 90d supply, fill #1
  Filled 2024-02-14: qty 90, 90d supply, fill #2
  Filled 2024-05-15: qty 90, 90d supply, fill #3
  Filled 2024-05-15: qty 90, 90d supply, fill #0

## 2023-09-04 ENCOUNTER — Encounter (HOSPITAL_COMMUNITY): Payer: Self-pay

## 2023-09-04 ENCOUNTER — Ambulatory Visit (HOSPITAL_COMMUNITY)
Admission: EM | Admit: 2023-09-04 | Discharge: 2023-09-04 | Disposition: A | Discharge status: Home / Self Care | Attending: Emergency Medicine | Admitting: Emergency Medicine

## 2023-09-04 DIAGNOSIS — S61213A Laceration without foreign body of left middle finger without damage to nail, initial encounter: Secondary | ICD-10-CM

## 2023-09-04 DIAGNOSIS — Z23 Encounter for immunization: Secondary | ICD-10-CM

## 2023-09-04 MED ORDER — TETANUS-DIPHTH-ACELL PERTUSSIS 5-2.5-18.5 LF-MCG/0.5 IM SUSY
PREFILLED_SYRINGE | INTRAMUSCULAR | Status: AC
  Filled 2023-09-04: qty 0.5

## 2023-09-04 MED ORDER — TETANUS-DIPHTH-ACELL PERTUSSIS 5-2.5-18.5 LF-MCG/0.5 IM SUSY
0.5000 mL | PREFILLED_SYRINGE | Freq: Once | INTRAMUSCULAR | Status: AC
  Administered 2023-09-04: 0.5 mL via INTRAMUSCULAR

## 2023-09-04 NOTE — Discharge Instructions (Signed)
 We have updated your tetanus vaccine today  Please keep the skin glue dry until it falls off on its own. Do not pick at the glue or pull it off yourself. If it falls off early, that's okay! Sometimes it will stay on for 6-7 days. Do not soak the finger in any water or the glue will dissolve You can cover the finger with gauze or bandaid   Monitor for any signs of infection - increased pain, redness, swelling, or foul drainage. Please return if needed

## 2023-09-04 NOTE — ED Triage Notes (Signed)
 Patient was working in the kitchen cutting lemons and the cut her left middle finger about 30 minutes ago. Last tetanus unknown.

## 2023-09-04 NOTE — ED Provider Notes (Signed)
 [facility]    CSN: [id number] Arrival date & time: 09/04/23  1640     History   Chief Complaint Chief Complaint  Patient presents with   Laceration    HPI Rachel Rogers is a 68 y.o. female.  Patient reports she was cutting a lemon, and the knife cut her left hand middle finger.  Occurred about 1 hour prior to arrival.  Was concerned about the amount of bleeding. Last tetanus unknown  Past Medical History:  Diagnosis Date   Hypercholesterolemia     There are no active problems to display for this patient.   Past Surgical History:  Procedure Laterality Date   ABDOMINAL HYSTERECTOMY      OB History   No obstetric history on file.      Home Medications    Prior to Admission medications   Medication Sig Start Date End Date Taking? Authorizing Provider  atorvastatin  (LIPITOR) 20 MG tablet Take 1 tablet (20 mg total) by mouth daily. 07/20/23     ferrous sulfate 325 (65 FE) MG tablet Take 325 mg by mouth daily with breakfast.    [provider]  hydrochlorothiazide  (HYDRODIURIL ) 12.5 MG tablet Take 1 tablet (12.5 mg total) by mouth daily. 08/09/23     ofloxacin  (OCUFLOX ) 0.3 % ophthalmic solution Place 1 drop into the right eye 4 (four) times daily. 08/29/20     PRESCRIPTION MEDICATION NOVA Complete  Mix 1 scoop in 12 ounces of water or juice    [provider]  valACYclovir  (VALTREX ) 500 MG tablet Take 1 tablet (500 mg total) by mouth 2 (two) times daily. 06/28/23       Family History Family History  Problem Relation Age of Onset   Breast cancer Maternal Aunt     Social History Social History   Tobacco Use   Smoking status: Never   Smokeless tobacco: Never  Substance Use Topics   Alcohol use: No   Drug use: No     Allergies   Patient has no known allergies.   Review of Systems Review of Systems Per HPI  Physical Exam Triage Vital Signs ED Triage Vitals  Encounter Vitals Group     BP 09/04/23 1655 (!) 141/98      Systolic BP Percentile --      Diastolic BP Percentile --      Pulse Rate 09/04/23 1655 97     Resp 09/04/23 1655 16     Temp 09/04/23 1655 98.1 F (36.7 C)     Temp Source 09/04/23 1655 Oral     SpO2 09/04/23 1655 100 %     Weight --      Height --      Head Circumference --      Peak Flow --      Pain Score 09/04/23 1650 0     Pain Loc --      Pain Education --      Exclude from Growth Chart --    No data found.  Updated Vital Signs BP (!) 141/98   Pulse 97   Temp 98.1 F (36.7 C) (Oral)   Resp 16   SpO2 100%    Physical Exam Vitals and nursing note reviewed.  Constitutional:      General: She is not in acute distress.    Appearance: Normal appearance.  HENT:     Mouth/Throat:     Pharynx: Oropharynx is clear.  Cardiovascular:     Rate and Rhythm: Normal rate  and regular rhythm.     Heart sounds: Normal heart sounds.  Pulmonary:     Effort: Pulmonary effort is normal.     Breath sounds: Normal breath sounds.  Musculoskeletal:     Comments: Full ROM of left hand finger joints. MCP DIP and PIP  Skin:    Findings: Laceration present.     Comments: There is one small L shaped laceration on the left hand distal middle finger, lateral to nail. No nail damage. Laceration is about 1 cm long each side. The skin is still attached at the middle of laceration. Small skin flap.  Neurological:     Mental Status: She is alert and oriented to person, place, and time.      UC Treatments / Results  Labs (all labs ordered are listed, but only abnormal results are displayed) Labs Reviewed - No data to display  EKG   Radiology No results found.  Procedures Laceration Repair  Date/Time: 09/04/2023 6:31 PM  Performed by: Creighton Doffing, PA-C Authorized by: Creighton Doffing, PA-C   Consent:    Consent obtained:  Verbal   Consent given by:  Patient   Risks discussed:  Infection, pain and poor cosmetic result Universal protocol:    Procedure explained and questions  answered to patient or proxy's satisfaction: yes     Patient identity confirmed:  Verbally with patient Anesthesia:    Anesthesia method:  None Laceration details:    Location:  Finger   Finger location:  L long finger   Length (cm):  1   Depth (mm):  0.3 Exploration:    Hemostasis achieved with:  Direct pressure (pressure bandage)   Wound exploration: entire depth of wound visualized   Treatment:    Area cleansed with:  Shur-Clens and saline Skin repair:    Repair method:  Tissue adhesive Approximation:    Approximation:  Close Repair type:    Repair type:  Simple Post-procedure details:    Dressing:  Non-adherent dressing and bulky dressing   Procedure completion:  Tolerated well, no immediate complications  (including critical care time)  Medications Ordered in UC Medications  Tdap (BOOSTRIX) injection 0.5 mL (0.5 mLs Intramuscular Given 09/04/23 1809)    Initial Impression / Assessment and Plan / UC Course  I have reviewed the triage vital signs and the nursing notes.  Pertinent labs & imaging results that were available during my care of the patient were reviewed by me and considered in my medical decision making (see chart for details).  Finger laceration Bleeding controlled with pressure bandage in triage. Skin flap still mostly attached, able to close fully with skin glue Advise wound care, pain control, signs of infection to monitor and return for. Tetanus updated today as well Patient agrees to plan, all questions answered   Final Clinical Impressions(s) / UC Diagnoses   Final diagnoses:  Laceration of middle finger of left hand without complication, initial encounter  Need for prophylactic vaccination with combined diphtheria-tetanus-pertussis (DTP) vaccine     Discharge Instructions      We have updated your tetanus vaccine today  Please keep the skin glue dry until it falls off on its own. Do not pick at the glue or pull it off yourself. If it falls  off early, that's okay! Sometimes it will stay on for 6-7 days. Do not soak the finger in any water or the glue will dissolve You can cover the finger with gauze or bandaid   Monitor for any signs of infection -  increased pain, redness, swelling, or foul drainage. Please return if needed    ED Prescriptions   None    PDMP not reviewed this encounter.   Creighton Doffing, [place] 09/04/23 [phone]

## 2023-11-01 ENCOUNTER — Other Ambulatory Visit (HOSPITAL_COMMUNITY): Payer: Self-pay

## 2023-11-15 ENCOUNTER — Other Ambulatory Visit (HOSPITAL_COMMUNITY): Payer: Self-pay

## 2024-02-07 ENCOUNTER — Other Ambulatory Visit (HOSPITAL_COMMUNITY): Payer: Self-pay

## 2024-02-08 ENCOUNTER — Other Ambulatory Visit: Payer: Self-pay | Admitting: Internal Medicine

## 2024-02-08 DIAGNOSIS — Z1231 Encounter for screening mammogram for malignant neoplasm of breast: Secondary | ICD-10-CM

## 2024-02-14 ENCOUNTER — Other Ambulatory Visit (HOSPITAL_COMMUNITY): Payer: Self-pay

## 2024-02-14 DIAGNOSIS — Z0001 Encounter for general adult medical examination with abnormal findings: Secondary | ICD-10-CM | POA: Diagnosis not present

## 2024-02-14 DIAGNOSIS — E663 Overweight: Secondary | ICD-10-CM | POA: Diagnosis not present

## 2024-02-14 DIAGNOSIS — E785 Hyperlipidemia, unspecified: Secondary | ICD-10-CM | POA: Diagnosis not present

## 2024-02-14 DIAGNOSIS — I10 Essential (primary) hypertension: Secondary | ICD-10-CM | POA: Diagnosis not present

## 2024-03-13 ENCOUNTER — Ambulatory Visit
Admission: RE | Admit: 2024-03-13 | Discharge: 2024-03-13 | Disposition: A | Discharge status: Home / Self Care | Source: Ambulatory Visit | Attending: Internal Medicine | Admitting: Internal Medicine

## 2024-03-13 DIAGNOSIS — Z1231 Encounter for screening mammogram for malignant neoplasm of breast: Secondary | ICD-10-CM | POA: Diagnosis not present

## 2024-04-13 DIAGNOSIS — Z780 Asymptomatic menopausal state: Secondary | ICD-10-CM | POA: Diagnosis not present

## 2024-05-15 ENCOUNTER — Other Ambulatory Visit: Payer: Self-pay

## 2024-05-15 ENCOUNTER — Other Ambulatory Visit (HOSPITAL_COMMUNITY): Payer: Self-pay
